# Patient Record
Sex: Female | Born: 2000 | Hispanic: No | Marital: Single | Smoking: Never smoker
Health system: Southern US, Community
[De-identification: ages and names within clinical notes are randomized; demographics above are authoritative.]

## PROBLEM LIST (undated history)

## (undated) DIAGNOSIS — J45909 Unspecified asthma, uncomplicated: Secondary | ICD-10-CM

---
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
## 2020-05-17 ENCOUNTER — Other Ambulatory Visit: Payer: Self-pay | Admitting: Orthopedic Surgery

## 2020-05-17 DIAGNOSIS — S82872A Displaced pilon fracture of left tibia, initial encounter for closed fracture: Secondary | ICD-10-CM

## 2020-05-17 DIAGNOSIS — S92114D Nondisplaced fracture of neck of right talus, subsequent encounter for fracture with routine healing: Secondary | ICD-10-CM

## 2020-05-18 ENCOUNTER — Ambulatory Visit
Admission: RE | Admit: 2020-05-18 | Discharge: 2020-05-18 | Disposition: A | Discharge status: Home / Self Care | Payer: Medicaid Other | Source: Ambulatory Visit | Attending: Orthopedic Surgery | Admitting: Orthopedic Surgery

## 2020-05-18 ENCOUNTER — Other Ambulatory Visit: Payer: Self-pay

## 2020-05-18 ENCOUNTER — Emergency Department: Payer: Medicaid Other

## 2020-05-18 ENCOUNTER — Emergency Department
Admission: EM | Admit: 2020-05-18 | Discharge: 2020-05-18 | Disposition: A | Discharge status: Home / Self Care | Payer: Medicaid Other | Attending: Emergency Medicine | Admitting: Emergency Medicine

## 2020-05-18 DIAGNOSIS — Z881 Allergy status to other antibiotic agents status: Secondary | ICD-10-CM | POA: Diagnosis not present

## 2020-05-18 DIAGNOSIS — S0083XA Contusion of other part of head, initial encounter: Secondary | ICD-10-CM | POA: Insufficient documentation

## 2020-05-18 DIAGNOSIS — S92114D Nondisplaced fracture of neck of right talus, subsequent encounter for fracture with routine healing: Secondary | ICD-10-CM | POA: Insufficient documentation

## 2020-05-18 DIAGNOSIS — J45909 Unspecified asthma, uncomplicated: Secondary | ICD-10-CM | POA: Insufficient documentation

## 2020-05-18 DIAGNOSIS — S161XXA Strain of muscle, fascia and tendon at neck level, initial encounter: Secondary | ICD-10-CM | POA: Insufficient documentation

## 2020-05-18 DIAGNOSIS — S82872A Displaced pilon fracture of left tibia, initial encounter for closed fracture: Secondary | ICD-10-CM | POA: Diagnosis present

## 2020-05-18 DIAGNOSIS — S0993XA Unspecified injury of face, initial encounter: Secondary | ICD-10-CM | POA: Diagnosis present

## 2020-05-18 MED ORDER — CYCLOBENZAPRINE HCL 10 MG PO TABS: 10.0000 mg | ORAL_TABLET | Freq: Three times a day (TID) | ORAL | 0 refills | Status: AC | PRN

## 2020-05-18 MED ORDER — IBUPROFEN 600 MG PO TABS: 600.0000 mg | ORAL_TABLET | Freq: Three times a day (TID) | ORAL | 0 refills | Status: AC | PRN
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
## 2020-05-18 NOTE — Discharge Instructions (Signed)
Your maxillofacial CT findings were negative for facial fracture.  Findings are consistent with soft tissue edema.  Follow discharge care instructions and continue previous medications.  Start taking Flexeril for cervical strain.
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
